# Patient Record
Sex: Male | Born: 2000 | Race: Black or African American | Hispanic: No | Marital: Single | Smoking: Never smoker
Health system: Southern US, Community
[De-identification: ages and names within clinical notes are randomized; demographics above are authoritative.]

## PROBLEM LIST (undated history)

## (undated) HISTORY — PX: TONSILLECTOMY: SUR1361

---
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
## 2020-06-09 ENCOUNTER — Emergency Department (HOSPITAL_COMMUNITY): Payer: PRIVATE HEALTH INSURANCE

## 2020-06-09 ENCOUNTER — Encounter (HOSPITAL_COMMUNITY): Payer: Self-pay | Admitting: *Deleted

## 2020-06-09 ENCOUNTER — Emergency Department (HOSPITAL_COMMUNITY)
Admission: EM | Admit: 2020-06-09 | Discharge: 2020-06-09 | Disposition: A | Discharge status: Home / Self Care | Payer: PRIVATE HEALTH INSURANCE | Attending: Emergency Medicine | Admitting: Emergency Medicine

## 2020-06-09 DIAGNOSIS — R569 Unspecified convulsions: Secondary | ICD-10-CM | POA: Diagnosis present

## 2020-06-09 LAB — URINALYSIS, ROUTINE W REFLEX MICROSCOPIC
Bacteria, UA: NONE SEEN
Bilirubin Urine: NEGATIVE
Glucose, UA: NEGATIVE mg/dL
Ketones, ur: NEGATIVE mg/dL
Leukocytes,Ua: NEGATIVE
Nitrite: NEGATIVE
Protein, ur: NEGATIVE mg/dL
Specific Gravity, Urine: 1.017 (ref 1.005–1.030)
pH: 5 (ref 5.0–8.0)

## 2020-06-09 LAB — COMPREHENSIVE METABOLIC PANEL
ALT: 23 U/L (ref 0–44)
AST: 26 U/L (ref 15–41)
Albumin: 4.4 g/dL (ref 3.5–5.0)
Alkaline Phosphatase: 54 U/L (ref 38–126)
Anion gap: 10 (ref 5–15)
BUN: 12 mg/dL (ref 6–20)
CO2: 20 mmol/L — ABNORMAL LOW (ref 22–32)
Calcium: 9.1 mg/dL (ref 8.9–10.3)
Chloride: 109 mmol/L (ref 98–111)
Creatinine, Ser: 1.22 mg/dL (ref 0.61–1.24)
GFR calc Af Amer: >60 mL/min (ref >60)
GFR calc non Af Amer: >60 mL/min (ref >60)
Glucose, Bld: 128 mg/dL — ABNORMAL HIGH (ref 70–99)
Potassium: 4.2 mmol/L (ref 3.5–5.1)
Sodium: 139 mmol/L (ref 135–145)
Total Bilirubin: 1.2 mg/dL (ref 0.3–1.2)
Total Protein: 6.8 g/dL (ref 6.5–8.1)

## 2020-06-09 LAB — MAGNESIUM: Magnesium: 2.7 mg/dL — ABNORMAL HIGH (ref 1.7–2.4)

## 2020-06-09 LAB — CBC WITH DIFFERENTIAL/PLATELET
Abs Immature Granulocytes: 0.13 10*3/uL — ABNORMAL HIGH (ref 0.00–0.07)
Basophils Absolute: 0.1 10*3/uL (ref 0.0–0.1)
Basophils Relative: 1 %
Eosinophils Absolute: 0.1 10*3/uL (ref 0.0–0.5)
Eosinophils Relative: 1 %
HCT: 45.9 % (ref 39.0–52.0)
Hemoglobin: 14.1 g/dL (ref 13.0–17.0)
Immature Granulocytes: 2 %
Lymphocytes Relative: 10 %
Lymphs Abs: 0.8 10*3/uL (ref 0.7–4.0)
MCH: 21.7 pg — ABNORMAL LOW (ref 26.0–34.0)
MCHC: 30.7 g/dL (ref 30.0–36.0)
MCV: 70.5 fL — ABNORMAL LOW (ref 80.0–100.0)
Monocytes Absolute: 0.5 10*3/uL (ref 0.1–1.0)
Monocytes Relative: 5 %
Neutro Abs: 7.3 10*3/uL (ref 1.7–7.7)
Neutrophils Relative %: 81 %
Platelets: 145 10*3/uL — ABNORMAL LOW (ref 150–400)
RBC: 6.51 MIL/uL — ABNORMAL HIGH (ref 4.22–5.81)
RDW: 15.9 % — ABNORMAL HIGH (ref 11.5–15.5)
WBC: 8.8 10*3/uL (ref 4.0–10.5)
nRBC: 0 % (ref 0.0–0.2)

## 2020-06-09 LAB — ETHANOL: Alcohol, Ethyl (B): <10 mg/dL (ref <10)

## 2020-06-09 LAB — RAPID URINE DRUG SCREEN, HOSP PERFORMED
Amphetamines: NOT DETECTED
Barbiturates: NOT DETECTED
Benzodiazepines: NOT DETECTED
Cocaine: NOT DETECTED
Opiates: NOT DETECTED
Tetrahydrocannabinol: NOT DETECTED

## 2020-06-09 NOTE — Discharge Instructions (Signed)
Department of Motor Vehicle [facility]) of [place] regulations for seizures - It is the patient's responsibility to report the incidence of the seizure in the state of [place]. [place] has no statutory provision requiring physicians to report patients diagnosed with epilepsy or seizures to a central state agency.  The recommended DMV regulation requirement for a driver in [place] for an individual with a seizure is that they be seizure-free for 6-12 months. However, the DMV may consider the following exceptions to this general rule where: (1) a physician-directed change in medication causes a seizure and the individual immediately resumes the previous therapy which controlled seizures; (2) there is a history of nocturnal seizures or seizures which do not involve loss of consciousness, loss of control of motor function, or loss of appropriate sensation and information process; and (3) an individual has a seizure disorder preceded by an aura (warning) lasting 2-3 minutes. While the [facility] may also give consideration to other unusual circumstances which may affect the general requirement that drivers be seizure-free for 6-12 months, interpretation of these circumstances and assignment of restrictions is at the discretion of the Medical Advisor. The DMV also considers compliance with medical therapy essential for safe driving. [facility] [place] Physician's Guide to [employer] (June, 1995 ed.)] The Department learns of an individual's condition by inquiring on the application form or renewal form, a physician's report to the [facility], an accident report or from correspondence from the individual. The person may be required to submit a Medical Report Form either annually or semi-annually.  Do not drive, swim, take baths, operate heavy machinery, stand on ladders or other high platforms or do any other activities that would be dangerous if you had another seizure.   Contact a health care provider if: You have another  seizure. You have seizures more often. Your seizure symptoms change. You continue to have seizures with treatment. You have symptoms of an infection or illness. They might increase your risk of having a seizure. Get help right away if: You have a seizure: That lasts longer than 5 minutes. That is different than previous seizures. That leaves you unable to speak or use a part of your body. That makes it harder to breathe. After a head injury. You have: Multiple seizures in a row. Confusion or a severe headache right after a seizure. You are having seizures more often. You do not wake up immediately after a seizure. You injure yourself during a seizure.

## 2020-06-09 NOTE — ED Provider Notes (Signed)
[name] EMERGENCY DEPARTMENT Provider Note   CSN: [id number] Arrival date & time: 06/09/20  7829     History Chief Complaint  Patient presents with  . Seizures    new onset    Jay Harrison is a 19 y.o. male.  The history is provided by the patient and a relative. No language interpreter was used.  Seizures Seizure activity on arrival: no   Seizure type:  Grand mal Preceding symptoms: no sensation of an aura present, no headache and no vision change   Initial focality:  Unable to specify Episode characteristics: apnea, confusion, disorientation, generalized shaking, stiffening, tongue biting and unresponsiveness   Episode characteristics: no combativeness   Postictal symptoms: confusion   Return to baseline: yes   Severity:  Mild Duration: less than 5 minutes. Timing:  Once Number of seizures this episode:  1 Context: decreased sleep   Context: not alcohol withdrawal, not cerebral palsy, not change in medication, not developmental delay, not drug use, not emotional upset, not family hx of seizures, not fever, not flashing visual stimuli, not hydrocephalus, not intracranial lesion, not intracranial shunt, not possible hypoglycemia, not possible medication ingestion, not pregnant, not previous head injury and not stress   Recent head injury:  No recent head injuries History of seizures: no        No past medical history on file.  There are no problems to display for this patient.   No past surgical history on file.     No family history on file.  Social History   Tobacco Use  . Smoking status: Never Smoker  Substance Use Topics  . Alcohol use: No  . Drug use: No    Home Medications Prior to Admission medications   Medication Sig Start Date End Date Taking? Authorizing Provider  ofloxacin (OCUFLOX) 0.3 % ophthalmic solution 5 drops in the right EAR twice a day for 1 week. 08/07/15   Charm Rings, MD    Allergies     Penicillins  Review of Systems   Review of Systems  Neurological: Positive for seizures.  Ten systems reviewed and are negative for acute change, except as noted in the HPI.    Physical Exam Updated Vital Signs BP 125/78 (BP Location: Right Arm)   Pulse 96   Temp 98.2 F (36.8 C) (Oral)   Resp 18   SpO2 96%   Physical Exam Vitals and nursing note reviewed.  Constitutional:      General: He is not in acute distress.    Appearance: He is well-developed. He is not diaphoretic.  HENT:     Head: Normocephalic.     Comments: Petechiae around the eyes and neck Lip swelling and tooth marks to the upper and lower left lip without through and through laceration.  No vermilion border involvement Eyes:     General: No scleral icterus.    Conjunctiva/sclera: Conjunctivae normal.  Cardiovascular:     Rate and Rhythm: Normal rate and regular rhythm.     Heart sounds: Normal heart sounds.  Pulmonary:     Effort: Pulmonary effort is normal. No respiratory distress.     Breath sounds: Normal breath sounds.  Abdominal:     Palpations: Abdomen is soft.     Tenderness: There is no abdominal tenderness.  Musculoskeletal:     Cervical back: Normal range of motion and neck supple.  Skin:    General: Skin is warm and dry.  Neurological:     General: No  focal deficit present.     Mental Status: He is alert and oriented to person, place, and time. Mental status is at baseline.     Comments: Speech is clear and goal oriented, follows commands Major Cranial nerves without deficit, no facial droop Normal strength in upper and lower extremities bilaterally including dorsiflexion and plantar flexion, strong and equal grip strength Sensation normal to light and sharp touch Moves extremities without ataxia, coordination intact Normal finger to nose and rapid alternating movements Neg romberg, no pronator drift Normal gait  Psychiatric:        Behavior: Behavior normal.     ED Results /  Procedures / Treatments   Labs (all labs ordered are listed, but only abnormal results are displayed) Labs Reviewed - No data to display  EKG EKG Interpretation  Date/Time:  Saturday June 09 2020 09:44:24 EDT Ventricular Rate:  96 PR Interval:    QRS Duration: 104 QT Interval:  345 QTC Calculation: 436 R Axis:   82 Text Interpretation: Sinus rhythm no wpw, prolonged qt or brugada No old tracing to compare Confirmed by Deno Etienne [phone]) on 06/09/2020 9:51:06 AM   Radiology No results found.  Procedures Procedures (including critical care time)  Medications Ordered in ED Medications - No data to display  ED Course  I have reviewed the triage vital signs and the nursing notes.  Pertinent labs & imaging results that were available during my care of the patient were reviewed by me and considered in my medical decision making (see chart for details).    MDM Rules/Calculators/A&P                          This patient complains of seizure, this involves an extensive number of treatment options, and is a complaint that carries with it a high risk of complications and morbidity.  The differential diagnosis includes The differential diagnosis for includes but is not limited to idiopathic seizure, traumatic brain injury, intracranial hemorrhage, vascular lesion, mass or space containing lesion, degenerative neurologic disease, congenital brain abnormality, infectious etiology such as meningitis, encephalitis or abscess, metabolic disturbance including hyper or hypoglycemia, hyper or hyponatremia, hyperosmolar state, uremia, hepatic failure, hypocalcemia, hypomagnesemia.  Toxic substances such as cocaine, lidocaine, antidepressants, theophylline, alcohol withdrawal, drug withdrawal, eclampsia, hypertensive encephalopathy and anoxic brain injury.   I Ordered, reviewed, and interpreted labs, which included urine which is negative for infection, UDS within normal limits, magnesium just above  normal and likely of insignificant value. CMP with slightly elevated blood glucose likely acute phase reaction. I ordered imaging studies which included CT head and I independently visualized and interpreted imaging which showed no acute abnormalities Additional history obtained from parents at bedside Previous records obtained and reviewed   Patient with no evidence of focal neuro deficits on physical exam and is at mental baseline.  Labs and imaging have been reviewed.  Patient is advised to followup with PCP in regards to today's event.  Spoke with patient and family in detail about driving restrictions and home precautions.  Patient verbalizes understanding.  Answered all questions.  Patient is hemodynamically stable and in no acute distress prior to discharge.    Final Clinical Impression(s) / ED Diagnoses Final diagnoses:  Seizure [facility])    Rx / [place] Orders ED Discharge Orders    None       Margarita Mail, PA-C 06/09/20 [place], [place], DO 06/09/20 1512

## 2020-06-09 NOTE — ED Triage Notes (Signed)
Pt here via [facility] for new onset witnessed seizures.  Aunt heard a thud from upstairs and came down to see pt unresponsive with full body shaking.  When ems arrived pt was still post-ictal.  They placed 2L [place] on pt for sats of 91%, tachycardic in 119's.  Pt was post-ictal for approx 15 min.  No urinary incontinence, though he did bite his lip.  States drank a few beers last night, came home at 4 am.  Denies drug use.

## 2020-09-04 ENCOUNTER — Encounter: Payer: Self-pay | Admitting: Family Medicine

## 2020-09-04 ENCOUNTER — Ambulatory Visit (INDEPENDENT_AMBULATORY_CARE_PROVIDER_SITE_OTHER): Payer: PRIVATE HEALTH INSURANCE | Admitting: Family Medicine

## 2020-09-04 ENCOUNTER — Other Ambulatory Visit: Payer: Self-pay

## 2020-09-04 VITALS — BP 120/76 | HR 81 | Temp 97.9°F | Ht 74.5 in | Wt 201.8 lb

## 2020-09-04 DIAGNOSIS — R569 Unspecified convulsions: Secondary | ICD-10-CM | POA: Insufficient documentation

## 2020-09-04 DIAGNOSIS — Z0000 Encounter for general adult medical examination without abnormal findings: Secondary | ICD-10-CM | POA: Diagnosis not present

## 2020-09-04 NOTE — Assessment & Plan Note (Signed)
Reviewed ER visit. No clear cause of seizure though pt reports heavy alcohol use the night before, but no other hx of withdrawal seizures. Advised neurology consult to rule out disorder and evaluate further as this has not been done. Advised not riding a motorcycle or driving until more information about risk can be determined.

## 2020-09-04 NOTE — Patient Instructions (Addendum)
Seizure - neurology - should call you   #Referral I have placed a referral to a specialist for you. You should receive a phone call from the specialty office. Make sure your voicemail is not full and that if you are able to answer your phone to unknown or new numbers.   It may take up to 2 weeks to hear about the referral. If you do not hear anything in 2 weeks, please call our office and ask to speak with the referral coordinator.   Find a Education officer, community

## 2020-09-04 NOTE — Progress Notes (Signed)
Annual Exam   Chief Complaint:  Chief Complaint  Patient presents with  . Establish Care    no concerns    History of Present Illness:  Jay Harrison is a 19 y.o. presents today for annual examination.    #seizure - in June  - in the context of heavy drinking the night before - work up reassuring - has been driving. No recent seizure activity - cousin with similar experience of one seizure  Nutrition/Lifestyle Diet: unhealthy Exercise: gym 3 times a week, active job He is single partner, contraception - condoms most of the time.    Social History   Tobacco Use  Smoking Status Never Smoker  Smokeless Tobacco Never Used   Social History   Substance and Sexual Activity  Alcohol Use Yes   Comment: 1-2 times a month, 1-2    Social History   Substance and Sexual Activity  Drug Use No     Safety The patient wears seatbelts: yes.     The patient feels safe at home and in their relationships: yes.  General Health Dentist in the last year: No Eye doctor: yes  Weight Wt Readings from Last 3 Encounters:  09/04/20 201 lb 12 oz (91.5 kg)  06/09/20 190 lb (86.2 kg)   Patient has normal BMI  BMI Readings from Last 1 Encounters:  09/04/20 25.56 kg/m     Chronic disease screening Blood pressure monitoring:  BP Readings from Last 3 Encounters:  09/04/20 120/76  06/09/20 127/74  08/07/15 117/63    Lipid Monitoring: Indication for screening: age >35, obesity, diabetes, family hx, CV risk factors.  Lipid screening: Not Indicated  No results found for: CHOL, HDL, LDLCALC, LDLDIRECT, TRIG, CHOLHDL   Diabetes Screening: age >69, overweight, family hx, PCOS, hx of gestational diabetes, at risk ethnicity, elevated blood pressure >135/80.  Diabetes Screening screening: Not Indicated  No results found for: HGBA1C    There is no immunization history on file for this patient.  History reviewed. No pertinent past medical history.  Past Surgical History:   Procedure Laterality Date  . TONSILLECTOMY    . TYMPANOSTOMY TUBE PLACEMENT      Prior to Admission medications   Not on File    Allergies  Allergen Reactions  . Penicillins     unk     Social History   Socioeconomic History  . Marital status: Single    Spouse name: Not on file  . Number of children: Not on file  . Years of education: college  . Highest education level: Not on file  Occupational History  . Not on file  Tobacco Use  . Smoking status: Never Smoker  . Smokeless tobacco: Never Used  Vaping Use  . Vaping Use: Never used  Substance and Sexual Activity  . Alcohol use: Yes    Comment: 1-2 times a month, 1-2   . Drug use: No  . Sexual activity: Yes    Birth control/protection: Condom  Other Topics Concern  . Not on file  Social History Narrative   09/04/20   From: the area   Living: with mom and grandparents   Work: Curator currently   College: [employer] - transferring to [employer]      Family: good relationship with mom and grandparents      Enjoys: motorcycling, driving, basketball, pool      Exercise: gym - 3 times a week   Diet: eat a lot      Safety   Seat belts:  Yes    Guns: Yes  and secure   Safe in relationships: Yes    Social Determinants of Health   Financial Resource Strain:   . Difficulty of Paying Living Expenses: Not on file  Food Insecurity:   . Worried About Programme researcher, broadcasting/film/video in the Last Year: Not on file  . Ran Out of Food in the Last Year: Not on file  Transportation Needs:   . Lack of Transportation (Medical): Not on file  . Lack of Transportation (Non-Medical): Not on file  Physical Activity:   . Days of Exercise per Week: Not on file  . Minutes of Exercise per Session: Not on file  Stress:   . Feeling of Stress : Not on file  Social Connections:   . Frequency of Communication with Friends and Family: Not on file  . Frequency of Social Gatherings with Friends and Family: Not on file  . Attends Religious Services: Not on  file  . Active Member of Clubs or Organizations: Not on file  . Attends Banker Meetings: Not on file  . Marital Status: Not on file  Intimate Partner Violence:   . Fear of Current or Ex-Partner: Not on file  . Emotionally Abused: Not on file  . Physically Abused: Not on file  . Sexually Abused: Not on file    Family History  Problem Relation Age of Onset  . Cancer Father        unknown type   . High Cholesterol Maternal Grandfather     Review of Systems  Constitutional: Negative for chills and fever.  HENT: Negative for congestion and sore throat.   Eyes: Negative for blurred vision and double vision.  Respiratory: Negative for shortness of breath.   Cardiovascular: Negative for chest pain.  Gastrointestinal: Negative for heartburn, nausea and vomiting.  Genitourinary: Negative.   Musculoskeletal: Negative.  Negative for myalgias.  Skin: Negative for rash.  Neurological: Negative for dizziness and headaches.  Endo/Heme/Allergies: Does not bruise/bleed easily.  Psychiatric/Behavioral: Negative for depression. The patient is not nervous/anxious.      Physical Exam BP 120/76   Pulse 81   Temp 97.9 F (36.6 C) (Temporal)   Ht 6' 2.5" (1.892 m)   Wt 201 lb 12 oz (91.5 kg)   SpO2 97%   BMI 25.56 kg/m    BP Readings from Last 3 Encounters:  09/04/20 120/76  06/09/20 127/74  08/07/15 117/63      Physical Exam Constitutional:      General: He is not in acute distress.    Appearance: He is well-developed. He is not diaphoretic.  HENT:     Head: Normocephalic and atraumatic.     Right Ear: Tympanic membrane and ear canal normal.     Left Ear: Tympanic membrane and ear canal normal.     Nose: Nose normal.     Mouth/Throat:     Pharynx: Uvula midline.  Eyes:     General: No scleral icterus.    Conjunctiva/sclera: Conjunctivae normal.     Pupils: Pupils are equal, round, and reactive to light.  Cardiovascular:     Rate and Rhythm: Normal rate and  regular rhythm.     Heart sounds: Normal heart sounds. No murmur heard.   Pulmonary:     Effort: Pulmonary effort is normal. No respiratory distress.     Breath sounds: Normal breath sounds. No wheezing.  Abdominal:     General: Bowel sounds are normal. There is no distension.  Palpations: Abdomen is soft. There is no mass.     Tenderness: There is no abdominal tenderness. There is no guarding.  Musculoskeletal:        General: Normal range of motion.     Cervical back: Normal range of motion and neck supple.  Lymphadenopathy:     Cervical: No cervical adenopathy.  Skin:    General: Skin is warm and dry.     Capillary Refill: Capillary refill takes less than 2 seconds.  Neurological:     Mental Status: He is alert and oriented to person, place, and time.        Results:  PHQ-9:   Depression screen PHQ 2/9 09/04/2020  Decreased Interest 0  Down, Depressed, Hopeless 0  PHQ - 2 Score 0      Assessment: 19 y.o. here for routine annual physical examination.  Plan: Problem List Items Addressed This Visit      Other   Seizure [facility])    Reviewed ER visit. No clear cause of seizure though pt reports heavy alcohol use the night before, but no other hx of withdrawal seizures. Advised neurology consult to rule out disorder and evaluate further as this has not been done. Advised not riding a motorcycle or driving until more information about risk can be determined.       Relevant Orders   Ambulatory referral to Neurology    Other Visit Diagnoses    Annual physical exam    -  Primary      Screening: -- Blood pressure screen normal -- cholesterol screening: not due for screening -- Weight screening: normal -- Diabetes Screening: not due for screening -- Nutrition: Encouraged healthy diet and exercise  The ASCVD Risk score Denman George DC Jr., et al., 2013) failed to calculate for the following reasons:   The 2013 ASCVD risk score is only valid for ages 7 to 19  -- Statin  therapy for Age 45-75 with CVD risk >7.5%  Psych -- Depression screening (PHQ-9):    Safety -- tobacco screening: not using -- alcohol screening:  low-risk usage. -- no evidence of domestic violence or intimate partner violence.   Cancer Screening -- No age related cancer screening due  Immunizations  There is no immunization history on file for this patient.  -- flu vaccine declined -- TDAP q10 years unknown, record requested -- Covid-19 Vaccine unknown, record requested   Encouraged regular vision and dental screening. Encouraged healthy exercise and diet.   Lynnda Child

## 2020-09-11 ENCOUNTER — Encounter: Payer: Self-pay | Admitting: Neurology

## 2020-12-25 ENCOUNTER — Ambulatory Visit: Payer: PRIVATE HEALTH INSURANCE | Admitting: Neurology

## 2021-02-01 ENCOUNTER — Encounter: Payer: Self-pay | Admitting: Neurology

## 2021-02-01 ENCOUNTER — Ambulatory Visit (INDEPENDENT_AMBULATORY_CARE_PROVIDER_SITE_OTHER): Payer: BC Managed Care – PPO | Admitting: Neurology

## 2021-02-01 ENCOUNTER — Other Ambulatory Visit: Payer: Self-pay

## 2021-02-01 VITALS — BP 133/89 | HR 89 | Ht 74.5 in | Wt 209.0 lb

## 2021-02-01 DIAGNOSIS — R569 Unspecified convulsions: Secondary | ICD-10-CM | POA: Diagnosis not present

## 2021-02-01 NOTE — Progress Notes (Signed)
NEUROLOGY CONSULTATION NOTE  DARRAN GABAY MRN: [id number] DOB: 12-14-2001  Referring provider: Dr. Gweneth Dimitri Primary care provider: Dr. Gweneth Dimitri  Reason for consult:  seizures  Dear Dr Selena Batten:  Thank you for your kind referral of Jay Harrison for consultation of the above symptoms. Although his history is well known to you, please allow me to reiterate it for the purpose of our medical record. He is alone in the office today. Records and images were personally reviewed where available.   HISTORY OF PRESENT ILLNESS: This is a pleasant 20 year old right-handed man with no significant past medical history presenting for evaluation of new onset seizures. The first seizure occurred 06/09/20, his aunt heard him fall and found him unresponsive with full body shaking. He was reportedly post-ictal for 15 minutes with EMS. He had lip swelling and tooth marks on the upper and lower left lip. CBC, CMP, UDS, EtOH level negative. I personally reviewed head CT without contrast which did not show any acute changes. He had another seizure over a month ago, his grandmother said "I was stuck." he recalls being in his bed, then waking up in a chair. He states both occurred early in the morning, as he was waking up. He reports that both times, he had been drinking alcohol heavily for a week and was really mad, not sleeping well, and not eating much. He had been feeling really bad. When he comes to, he reports feeling back to normal, no focal weakness. He has stopped drinking alcohol since then and feels better. He lives with his mother. He denies any staring/unresponsive episodes, gaps in time, olfactory/gustatory hallucinations, deja vu, rising epigastric sensation, focal numbness/tingling/weakness, myoclonic jerks. He denies any headaches, dizziness, diplopia, dysarthria/dysphagia, neck/back pain, bowel/bladder dysfunction. Memory is really good. He works as a Curator. He had a normal birth and early  development.  There is no history of febrile convulsions, CNS infections such as meningitis/encephalitis, significant traumatic brain injury, neurosurgical procedures, or family history of seizures.   PAST MEDICAL HISTORY: History reviewed. No pertinent past medical history.  PAST SURGICAL HISTORY: Past Surgical History:  Procedure Laterality Date  . TONSILLECTOMY    . TYMPANOSTOMY TUBE PLACEMENT      MEDICATIONS: No current outpatient medications on file prior to visit.   No current facility-administered medications on file prior to visit.    ALLERGIES: Allergies  Allergen Reactions  . Penicillins     unk    FAMILY HISTORY: Family History  Problem Relation Age of Onset  . Cancer Father        unknown type   . High Cholesterol Maternal Grandfather     SOCIAL HISTORY: Social History   Socioeconomic History  . Marital status: Single    Spouse name: Not on file  . Number of children: Not on file  . Years of education: college  . Highest education level: Not on file  Occupational History  . Not on file  Tobacco Use  . Smoking status: Never Smoker  . Smokeless tobacco: Never Used  Vaping Use  . Vaping Use: Never used  Substance and Sexual Activity  . Alcohol use: Not Currently    Comment: 1-2 times a month, 1-2   . Drug use: No  . Sexual activity: Yes    Birth control/protection: Condom  Other Topics Concern  . Not on file  Social History Narrative   09/04/20   From: the area   Living: with mom and grandparents  Work: Curator currently   [employer]: [employer] - transferring to [employer]      Family: good relationship with mom and grandparents      Enjoys: motorcycling, driving, basketball, pool      Exercise: gym - 3 times a week   Diet: eat a lot      Safety   Seat belts: Yes    Guns: Yes  and secure   Safe in relationships: Yes       Right handed   Social Determinants of Health   Financial Resource Strain: Not on file  Food Insecurity: Not on file   Transportation Needs: Not on file  Physical Activity: Not on file  Stress: Not on file  Social Connections: Not on file  Intimate Partner Violence: Not on file     PHYSICAL EXAM: Vitals:   02/01/21 1240  BP: 133/89  Pulse: 89  SpO2: 100%   General: No acute distress Head:  Normocephalic/atraumatic Skin/Extremities: No rash, no edema Neurological Exam: Mental status: alert and oriented to person, place, and time, no dysarthria or aphasia, Fund of knowledge is appropriate.  Remote memory intact 1/3 delayed recall..  Attention and concentration are normal, 5/5 WORLD backwards. Cranial nerves: CN I: not tested CN II: pupils equal, round and reactive to light, visual fields intact CN III, IV, VI:  full range of motion, no nystagmus, no ptosis CN V: facial sensation intact CN VII: upper and lower face symmetric CN VIII: hearing intact to conversation Bulk & Tone: normal, no fasciculations. Motor: 5/5 throughout with no pronator drift. Sensation: intact to light touch, cold, pin, vibration and joint position sense.  No extinction to double simultaneous stimulation.  Romberg test negative Deep Tendon Reflexes: +2 throughout Plantar responses: downgoing bilaterally Cerebellar: no incoordination on finger to nose testing Gait: narrow-based and steady, able to tandem walk adequately. Tremor: none   IMPRESSION: This is a pleasant 20 year old right-handed man with new onset seizures, he had a convulsion in June 2021, and most recently in January 2022. He reports both occurred in the setting of heavy alcohol intake for a week, increased stress/anger, and poor sleep. He has stopped drinking alcohol. We discussed different causes of seizures,continued alcohol cessation was encouraged. A 1-hour EEG will be ordered to assess for abnormalities that increase risk for recurrent seizures. We discussed avoidance of seizure triggers, including alcohol and sleep deprivation. [place] driving laws were  discussed with the patient, and he knows to stop driving after a seizure, until 6 months seizure-free. Follow-up in 6 months, he knows to call for any changes.   Thank you for allowing me to participate in the care of this patient. Please do not hesitate to call for any questions or concerns.   [name], M.D.  CC: Dr. Selena Batten

## 2021-02-01 NOTE — Patient Instructions (Signed)
1. Schedule 1-hour EEG  2. Continue with alcohol avoidance  3. Follow-up in 6 months, call for any changes   Seizure Precautions: 1. If medication has been prescribed for you to prevent seizures, take it exactly as directed.  Do not stop taking the medicine without talking to your doctor first, even if you have not had a seizure in a long time.   2. Avoid activities in which a seizure would cause danger to yourself or to others.  Don't operate dangerous machinery, swim alone, or climb in high or dangerous places, such as on ladders, roofs, or girders.  Do not drive unless your doctor says you may.  3. If you have any warning that you may have a seizure, lay down in a safe place where you can't hurt yourself.    4.  No driving for 6 months from last seizure, as per [facility].   Please refer to the following link on the Epilepsy Foundation of America's website for more information: http://www.epilepsyfoundation.org/answerplace/Social/driving/drivingu.cfm    5.  Maintain good sleep hygiene. Avoid alcohol.  6.  Contact your doctor if you have any problems that may be related to the medicine you are taking.  7.  Call 911 and bring the patient back to the ED if:        A.  The seizure lasts longer than 5 minutes.       B.  The patient doesn't awaken shortly after the seizure  C.  The patient has new problems such as difficulty seeing, speaking or moving  D.  The patient was injured during the seizure  E.  The patient has a temperature over 102 F (39C)  F.  The patient vomited and now is having trouble breathing

## 2021-02-13 ENCOUNTER — Ambulatory Visit (INDEPENDENT_AMBULATORY_CARE_PROVIDER_SITE_OTHER): Payer: PRIVATE HEALTH INSURANCE | Admitting: Neurology

## 2021-02-13 ENCOUNTER — Other Ambulatory Visit: Payer: Self-pay

## 2021-02-13 DIAGNOSIS — R569 Unspecified convulsions: Secondary | ICD-10-CM

## 2021-02-22 NOTE — Procedures (Signed)
ELECTROENCEPHALOGRAM REPORT  Date of Study: 02/13/2021  Patient's Name: Jay Harrison MRN: [id number] Date of Birth: [birth year]  Referring Provider: Dr. [name]  Clinical History: This is a 20 year old with new onset seizures. EEG for classification.  Medications: none  Technical Summary: A multichannel digital 1-hour EEG recording measured by the international 10-20 system with electrodes applied with paste and impedances below 5000 ohms performed in our laboratory with EKG monitoring in an awake and asleep patient.  Hyperventilation was not performed. Photic stimulation was performed.  The digital EEG was referentially recorded, reformatted, and digitally filtered in a variety of bipolar and referential montages for optimal display.    Description: The patient is awake and asleep during the recording.  During maximal wakefulness, there is a symmetric, medium voltage 10 Hz posterior dominant rhythm that attenuates with eye opening.  The record is symmetric.  During drowsiness and sleep, there is an increase in theta slowing of the background.  Vertex waves and symmetric sleep spindles were seen.  Photic stimulation did not elicit any abnormalities.  There were no epileptiform discharges or electrographic seizures seen.    EKG lead was unremarkable.  Impression: This 1-hour awake and asleep EEG is normal.    Clinical Correlation: A normal EEG does not exclude a clinical diagnosis of epilepsy.  If further clinical questions remain, prolonged EEG may be helpful.  Clinical correlation is advised.   [name], M.D.

## 2021-03-14 ENCOUNTER — Ambulatory Visit: Payer: PRIVATE HEALTH INSURANCE | Admitting: Neurology

## 2021-07-27 IMAGING — CT CT HEAD W/O CM
4 series · 17 of 47 positions shown, 19 images · non-contrast
Comparison: None.

CLINICAL DATA: Seizure.  Abnormal neuro exam.

EXAM:
CT HEAD WITHOUT CONTRAST
TECHNIQUE: Contiguous axial images were obtained from the base of the skull
through the vertex without intravenous contrast.

[Series 3: head without · axial · non-contrast · 0.48mm/px · z∈[-124,-4]mm · 7 of 33 slices shown, 9 images]
[im 5/33  brain]
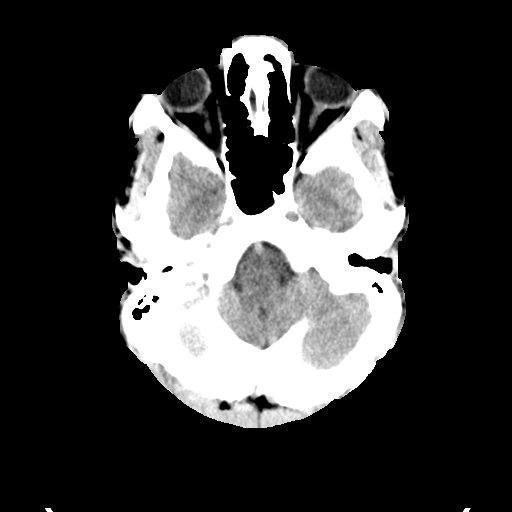
[im 5/33  bone]
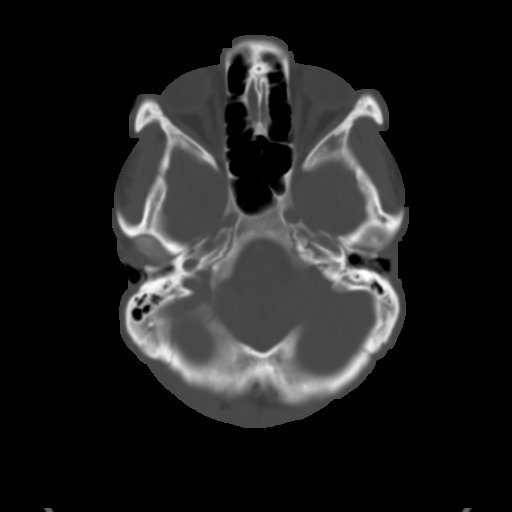
[im 9/33  brain]
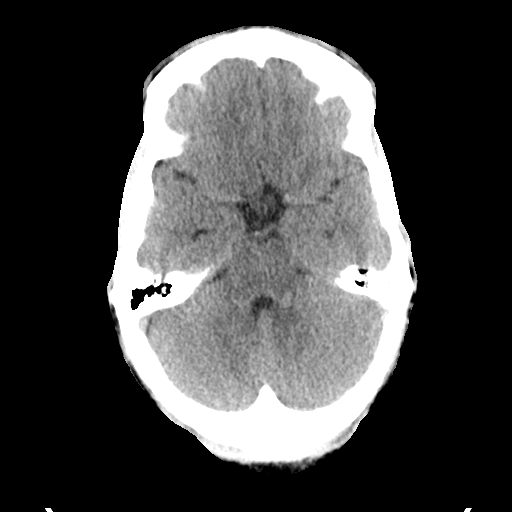
[im 13/33  brain]
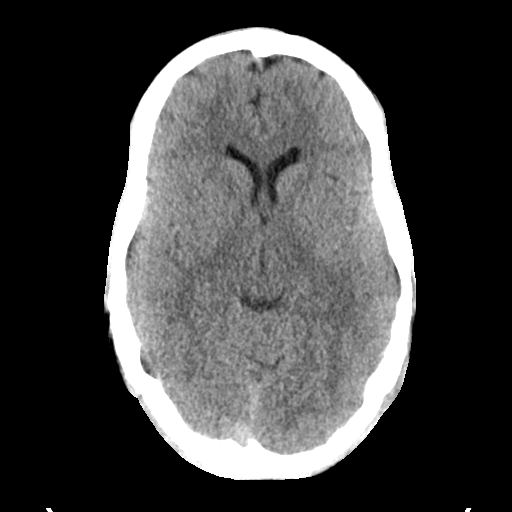
[im 17/33  brain]
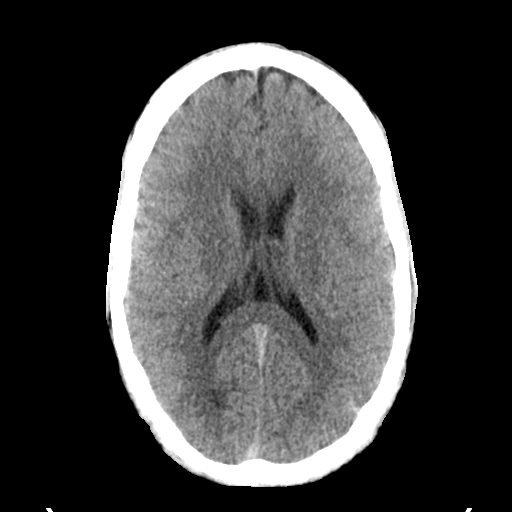
[im 21/33  brain]
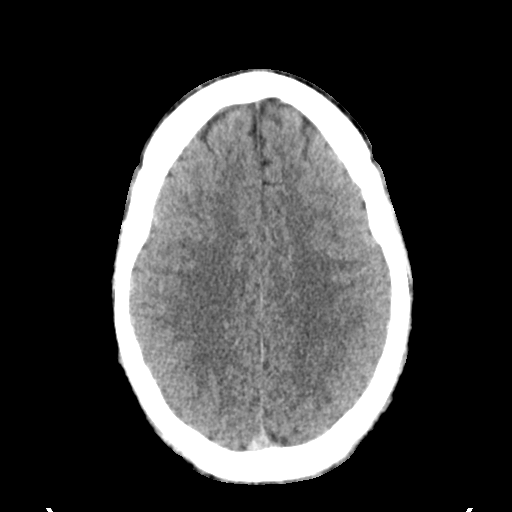
[im 21/33  bone]
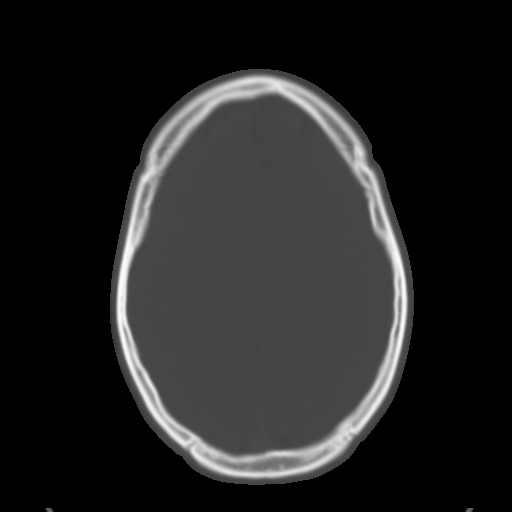
[im 25/33  brain]
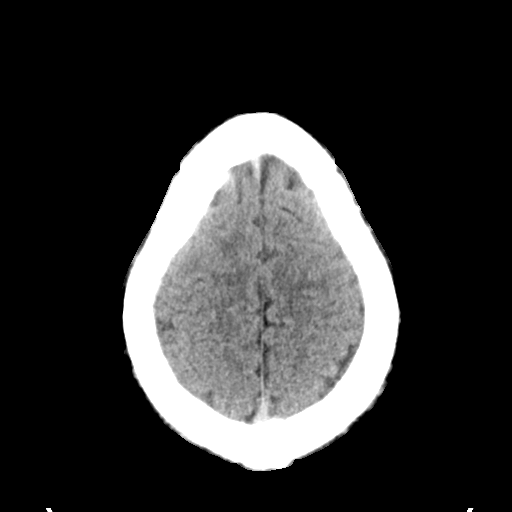
[im 29/33  brain]
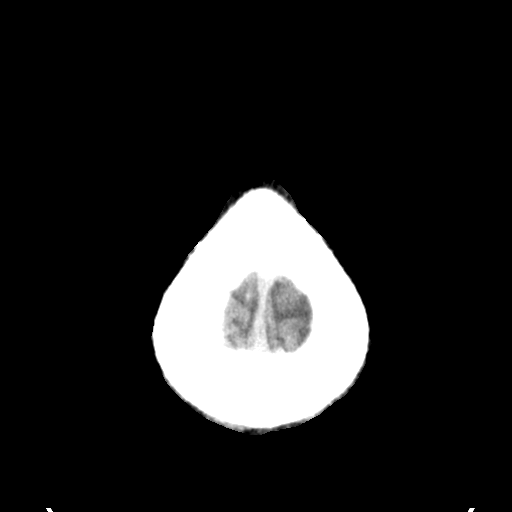

[Series 4: head bone · axial · 0.48mm/px · z∈[-128,-72]mm · 4 of 83 slices shown]
[im 9/83  bone]
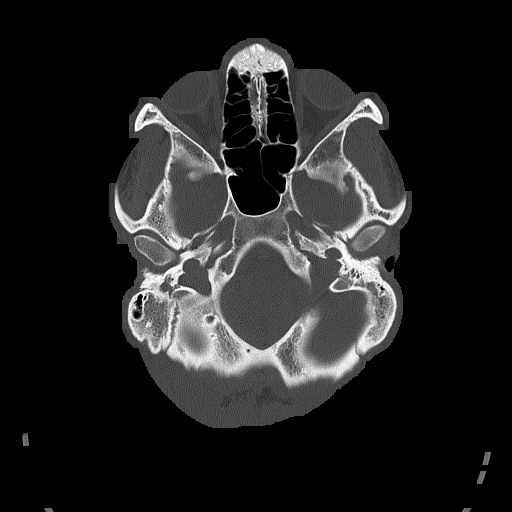
[im 17/83  bone]
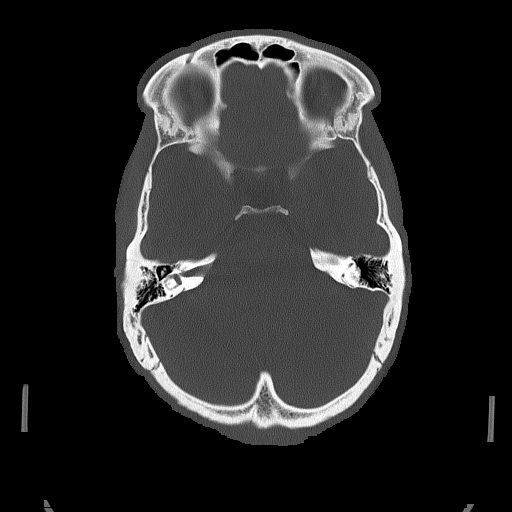
[im 25/83  bone]
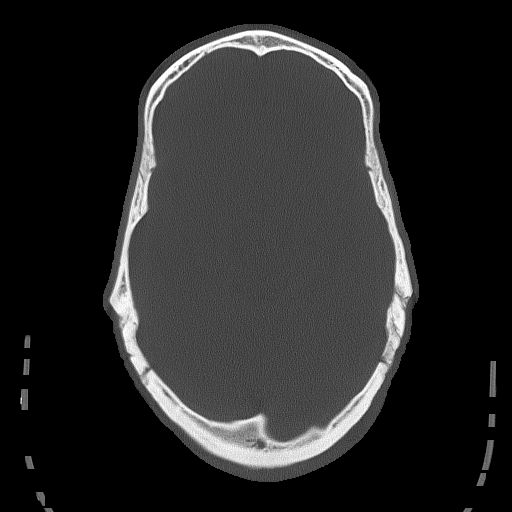
[im 37/83  bone]
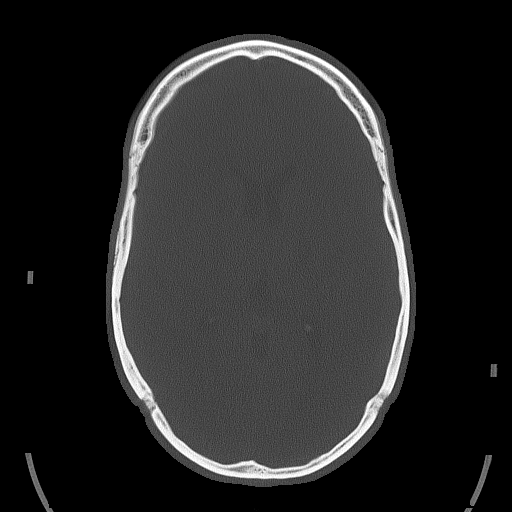

[Series 5: head without cor · coronal · non-contrast · 0.33mm/px · 3 of 75 slices shown]
[im 25/75  brain]
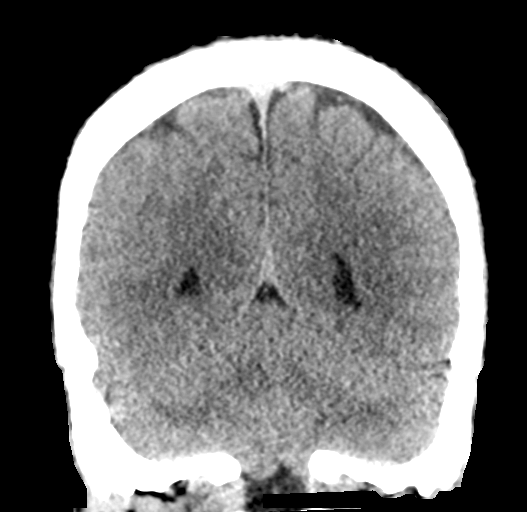
[im 33/75  brain]
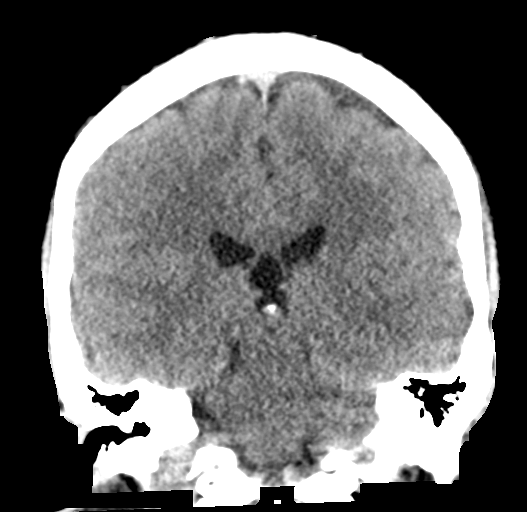
[im 42/75  brain]
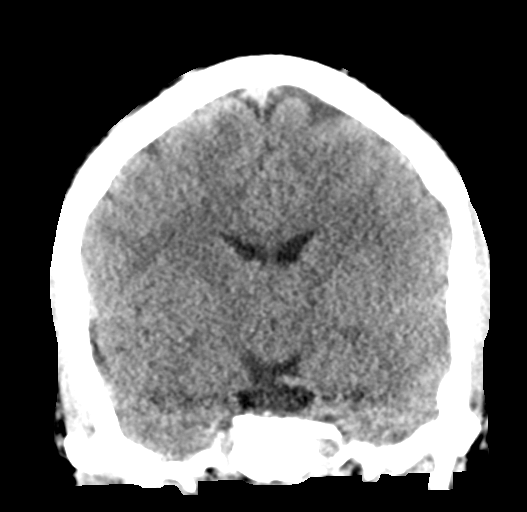

[Series 6: head without sag · sagittal · non-contrast · 0.33mm/px · 3 of 66 slices shown]
[im 22/66  brain]
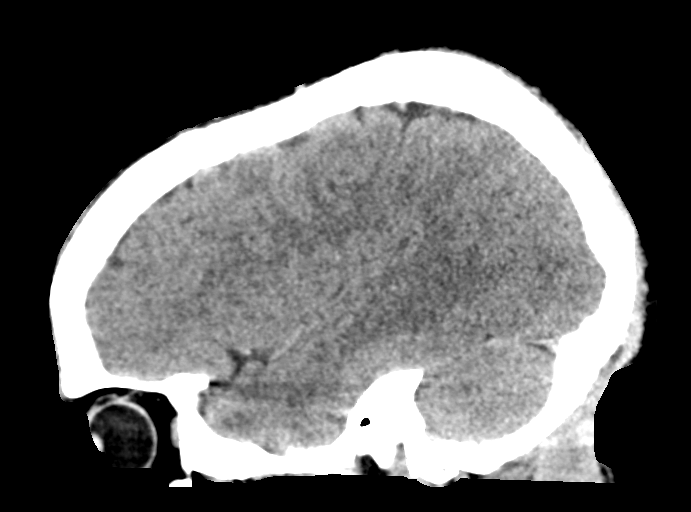
[im 33/66  brain]
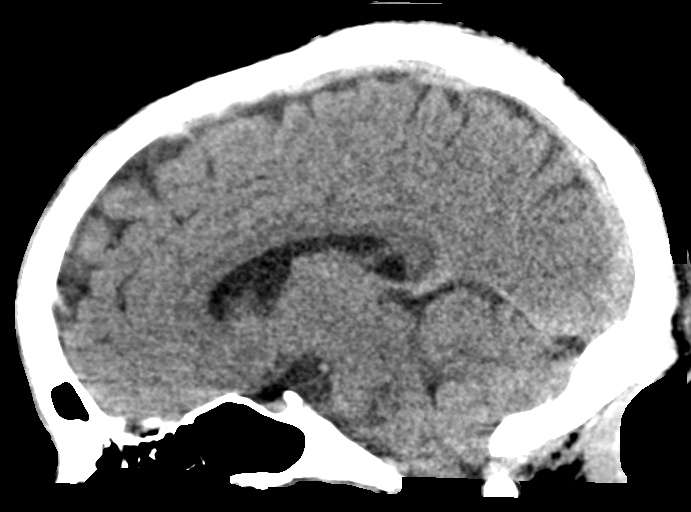
[im 44/66  brain]
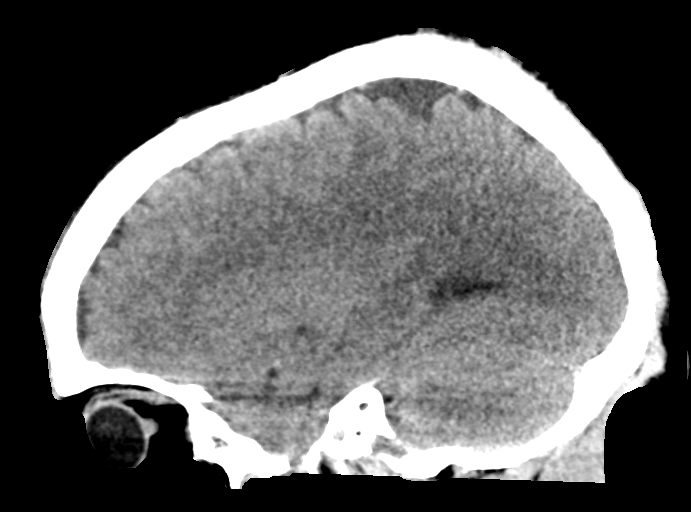

[17 of 47 positions shown; findings below may reference images not displayed]

FINDINGS: Brain: No subdural, epidural, or subarachnoid hemorrhage. Brainstem,
basal cisterns, and cerebellum are normal. Ventricles and sulci are
normal. No mass effect or midline shift. No acute cortical ischemia
or infarct.

Vascular: No hyperdense vessel or unexpected calcification.

Skull: Normal. Negative for fracture or focal lesion.

Sinuses/Orbits: No acute finding.

Other: None.
IMPRESSION: 1. No acute intracranial abnormalities.

## 2021-08-23 ENCOUNTER — Telehealth: Payer: Self-pay | Admitting: Neurology

## 2021-08-23 NOTE — Telephone Encounter (Signed)
Spoke with the patient and he stated he never got his EEG results from 02/13/21.

## 2021-08-23 NOTE — Telephone Encounter (Signed)
Pls let him know the EEG was normal. It is not like a pregnancy test that is positive or negative, just a snapshot of his brain waves. Has he had any more seizures? Thanks

## 2021-08-23 NOTE — Telephone Encounter (Signed)
Pt called and informed EEG was normal. It is not like a pregnancy test that is positive or negative, just a snapshot of his brain waves. He has not had any seizures, and has not had any alcohol

## 2021-08-27 ENCOUNTER — Ambulatory Visit: Payer: PRIVATE HEALTH INSURANCE | Admitting: Neurology

## 2021-12-09 ENCOUNTER — Ambulatory Visit (INDEPENDENT_AMBULATORY_CARE_PROVIDER_SITE_OTHER): Payer: No Typology Code available for payment source | Admitting: Neurology

## 2021-12-09 ENCOUNTER — Encounter: Payer: Self-pay | Admitting: Neurology

## 2021-12-09 ENCOUNTER — Other Ambulatory Visit: Payer: Self-pay

## 2021-12-09 VITALS — BP 127/87 | HR 79 | Ht 75.0 in | Wt 225.2 lb

## 2021-12-09 DIAGNOSIS — R569 Unspecified convulsions: Secondary | ICD-10-CM

## 2021-12-09 NOTE — Progress Notes (Signed)
NEUROLOGY FOLLOW UP OFFICE NOTE  Jay Harrison [id number] [birth year]  HISTORY OF PRESENT ILLNESS: I had the pleasure of seeing Jay Harrison in follow-up in the neurology clinic on 12/09/2021.  The patient was last seen 10 months ago for seizures. He had one in 05/2020 and another in 12/2020, both occurred early in the morning, both times he had been drinking alcohol heavily for a week and was not sleeping well or eating much. His 1-hour EEG in 01/2021 was normal. He had stopped alcohol use and has been eating more regularly and sleeping more, doing well seizure-free since 12/2020. He lives alone and denies any staring/unresponsive episodes, gaps in time, olfactory/gustatory hallucinations, focal numbness/tingling/weakness, myoclonic jerks. No significant headaches, dizziness, vision changes, no falls.    History on Initial Assessment 02/01/2021: This is a pleasant 20 year old right-handed man with no significant past medical history presenting for evaluation of new onset seizures. The first seizure occurred 06/09/20, his aunt heard him fall and found him unresponsive with full body shaking. He was reportedly post-ictal for 15 minutes with EMS. He had lip swelling and tooth marks on the upper and lower left lip. CBC, CMP, UDS, EtOH level negative. I personally reviewed head CT without contrast which did not show any acute changes. He had another seizure over a month ago, his grandmother said "I was stuck." he recalls being in his bed, then waking up in a chair. He states both occurred early in the morning, as he was waking up. He reports that both times, he had been drinking alcohol heavily for a week and was really mad, not sleeping well, and not eating much. He had been feeling really bad. When he comes to, he reports feeling back to normal, no focal weakness. He has stopped drinking alcohol since then and feels better. He lives with his mother. He denies any staring/unresponsive episodes, gaps in time,  olfactory/gustatory hallucinations, deja vu, rising epigastric sensation, focal numbness/tingling/weakness, myoclonic jerks. He denies any headaches, dizziness, diplopia, dysarthria/dysphagia, neck/back pain, bowel/bladder dysfunction. Memory is really good. He works as a Curator. He had a normal birth and early development.  There is no history of febrile convulsions, CNS infections such as meningitis/encephalitis, significant traumatic brain injury, neurosurgical procedures, or family history of seizures.  PAST MEDICAL HISTORY: History reviewed. No pertinent past medical history.  MEDICATIONS: No current outpatient medications on file prior to visit.   No current facility-administered medications on file prior to visit.    ALLERGIES: Allergies  Allergen Reactions   Penicillins     unk    FAMILY HISTORY: Family History  Problem Relation Age of Onset   Cancer Father        unknown type    High Cholesterol Maternal Grandfather     SOCIAL HISTORY: Social History   Socioeconomic History   Marital status: Single    Spouse name: Not on file   Number of children: Not on file   Years of education: college   Highest education level: Not on file  Occupational History   Not on file  Tobacco Use   Smoking status: Never   Smokeless tobacco: Never  Vaping Use   Vaping Use: Never used  Substance and Sexual Activity   Alcohol use: Not Currently    Comment: 1-2 times a month, 1-2    Drug use: No   Sexual activity: Yes    Birth control/protection: Condom  Other Topics Concern   Not on file  Social History Narrative  09/04/20   From: the area   Living: with mom and grandparents   Work: Curator currently   College: [employer] - transferring to [employer]      Family: good relationship with mom and grandparents      Enjoys: motorcycling, driving, basketball, pool      Exercise: gym - 3 times a week   Diet: eat a lot      Safety   Seat belts: Yes    Guns: Yes  and secure   Safe in  relationships: Yes       Right handed   Social Determinants of Health   Financial Resource Strain: Not on file  Food Insecurity: Not on file  Transportation Needs: Not on file  Physical Activity: Not on file  Stress: Not on file  Social Connections: Not on file  Intimate Partner Violence: Not on file     PHYSICAL EXAM: Vitals:   12/09/21 1420  BP: 127/87  Pulse: 79  SpO2: 98%   General: No acute distress Head:  Normocephalic/atraumatic Skin/Extremities: No rash, no edema Neurological Exam: alert and awake. No aphasia or dysarthria. Fund of knowledge is appropriate.  Attention and concentration are normal.   Cranial nerves: Pupils equal, round. Extraocular movements intact with no nystagmus. Visual fields full.  No facial asymmetry.  Motor: Bulk and tone normal, muscle strength 5/5 throughout with no pronator drift.   Finger to nose testing intact.  Gait narrow-based and steady, able to tandem walk adequately.  Romberg negative.   IMPRESSION: This is a pleasant 20 yo RH man with new onset seizures, he had a convulsion in June 2021, and another episode in January 2022. Both occurred in the setting of heavy alcohol intake for a week, increased stress, poor sleep/eating habits. His 1-hour EEG was normal. He denies any seizures or seizure-like symptoms since 12/2020 and has stopped alcohol intake and did lifestyle changes. We discussed avoidance of seizure triggers, we agreed to hold off on seizure medication at this time however if seizures recur without any triggers, we will plan to start seizure medication. He is aware of [place] driving laws to stop driving after a seizure until 6 months seizure-free. Follow-up in 6-8 months, call for any changes.    Thank you for allowing me to participate in his care.  Please do not hesitate to call for any questions or concerns.    [name], M.D.   CC: Dr. Selena Batten

## 2021-12-09 NOTE — Patient Instructions (Signed)
Good to hear you are doing well. Follow-up in 6-8 months, call for any changes   Seizure Precautions: 1. If medication has been prescribed for you to prevent seizures, take it exactly as directed.  Do not stop taking the medicine without talking to your doctor first, even if you have not had a seizure in a long time.   2. Avoid activities in which a seizure would cause danger to yourself or to others.  Don't operate dangerous machinery, swim alone, or climb in high or dangerous places, such as on ladders, roofs, or girders.  Do not drive unless your doctor says you may.  3. If you have any warning that you may have a seizure, lay down in a safe place where you can't hurt yourself.    4.  No driving for 6 months from last seizure, as per [facility].   Please refer to the following link on the Epilepsy Foundation of America's website for more information: http://www.epilepsyfoundation.org/answerplace/Social/driving/drivingu.cfm    5.  Maintain good sleep hygiene. Avoid alcohol.  6.  Contact your doctor if you have any problems that may be related to the medicine you are taking.  7.  Call 911 and bring the patient back to the ED if:        A.  The seizure lasts longer than 5 minutes.       B.  The patient doesn't awaken shortly after the seizure  C.  The patient has new problems such as difficulty seeing, speaking or moving  D.  The patient was injured during the seizure  E.  The patient has a temperature over 102 F (39C)  F.  The patient vomited and now is having trouble breathing

## 2022-06-01 ENCOUNTER — Emergency Department (HOSPITAL_COMMUNITY)
Admission: EM | Admit: 2022-06-01 | Discharge: 2022-06-01 | Disposition: A | Discharge status: Home / Self Care | Payer: No Typology Code available for payment source | Attending: Emergency Medicine | Admitting: Emergency Medicine

## 2022-06-01 ENCOUNTER — Encounter (HOSPITAL_COMMUNITY): Payer: Self-pay

## 2022-06-01 ENCOUNTER — Other Ambulatory Visit: Payer: Self-pay

## 2022-06-01 DIAGNOSIS — S161XXA Strain of muscle, fascia and tendon at neck level, initial encounter: Secondary | ICD-10-CM | POA: Diagnosis not present

## 2022-06-01 DIAGNOSIS — S199XXA Unspecified injury of neck, initial encounter: Secondary | ICD-10-CM | POA: Diagnosis present

## 2022-06-01 DIAGNOSIS — S39012A Strain of muscle, fascia and tendon of lower back, initial encounter: Secondary | ICD-10-CM | POA: Diagnosis not present

## 2022-06-01 DIAGNOSIS — Y92410 Unspecified street and highway as the place of occurrence of the external cause: Secondary | ICD-10-CM | POA: Diagnosis not present

## 2022-06-01 MED ORDER — IBUPROFEN 800 MG PO TABS
800.0000 mg | ORAL_TABLET | Freq: Once | ORAL | Status: AC
  Administered 2022-06-01: 800 mg via ORAL
  Filled 2022-06-01: qty 1

## 2022-06-01 MED ORDER — IBUPROFEN 600 MG PO TABS: 600.0000 mg | ORAL_TABLET | Freq: Four times a day (QID) | ORAL | 0 refills | Status: AC | PRN

## 2022-06-01 MED ORDER — CYCLOBENZAPRINE HCL 10 MG PO TABS: 10.0000 mg | ORAL_TABLET | Freq: Three times a day (TID) | ORAL | 0 refills | Status: AC | PRN
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
## 2022-06-24 ENCOUNTER — Encounter (HOSPITAL_COMMUNITY): Payer: Self-pay | Admitting: Emergency Medicine

## 2022-06-24 ENCOUNTER — Emergency Department (HOSPITAL_COMMUNITY)
Admission: EM | Admit: 2022-06-24 | Discharge: 2022-06-24 | Disposition: A | Discharge status: Home / Self Care | Payer: No Typology Code available for payment source | Attending: Emergency Medicine | Admitting: Emergency Medicine

## 2022-06-24 ENCOUNTER — Other Ambulatory Visit: Payer: Self-pay

## 2022-06-24 DIAGNOSIS — S00561A Insect bite (nonvenomous) of lip, initial encounter: Secondary | ICD-10-CM | POA: Diagnosis present

## 2022-06-24 DIAGNOSIS — W57XXXA Bitten or stung by nonvenomous insect and other nonvenomous arthropods, initial encounter: Secondary | ICD-10-CM | POA: Insufficient documentation

## 2022-06-24 MED ORDER — PREDNISONE 20 MG PO TABS: 40.0000 mg | ORAL_TABLET | Freq: Every day | ORAL | 0 refills | Status: AC

## 2022-06-24 NOTE — Discharge Instructions (Signed)
You came to the emergency department today to be evaluated for your lip swelling after being stung by an insect.  The swelling appears to be a localized reaction to the sting.  I have prescribed you with a course of steroids to help decrease inflammation.  Additionally may apply ice to the area for 20 minutes at a time and then give yourself a 20-minute break in between.  I have given you a prescription for steroids today.  Some common side effects include feelings of extra energy, feeling warm, increased appetite, and stomach upset.  If you are diabetic your sugars may run higher than usual.   You develop symptoms of an anaphylactic reaction. These may include: Flushed skin. Hives. Swelling of the eyes, lips, face, mouth, tongue, or throat. Difficulty breathing, speaking, or swallowing. Wheezing. Dizziness or light-headedness. Fainting. Pain or cramping in the abdomen. Vomiting. Diarrhea.

## 2022-06-24 NOTE — ED Provider Notes (Signed)
[name] EMERGENCY DEPARTMENT Provider Note   CSN: [id number] Arrival date & time: 06/24/22  4332     History  Chief Complaint  Patient presents with   Insect Bite    Jay Harrison is a 21 y.o. male status post history of hysterectomy.  Presents emergency department for complaint of insect sting.  Patient reports that yesterday at 12 AM he was stung by a wasp or hornet on his lower lip.  Patient reports significant swelling to lower lip.  Patient denies any hives, trouble swallowing, trouble breathing, additional facial swelling, nausea, vomiting, diarrhea, abdominal pain.  Patient reports that he has been stung at least once before approximately 2 weeks prior.  Patient was started his neck and did have localized swelling but no other symptoms.  HPI     Home Medications Prior to Admission medications   Medication Sig Start Date End Date Taking? Authorizing Provider  cyclobenzaprine (FLEXERIL) 10 MG tablet Take 1 tablet (10 mg total) by mouth 3 (three) times daily as needed for muscle spasms. 06/01/22   Roxy Horseman, PA-C  ibuprofen (ADVIL) 600 MG tablet Take 1 tablet (600 mg total) by mouth every 6 (six) hours as needed. 06/01/22   Roxy Horseman, PA-C      Allergies    Penicillins    Review of Systems   Review of Systems  Constitutional:  Negative for chills and fever.  HENT:  Positive for facial swelling.   Respiratory:  Negative for shortness of breath.   Gastrointestinal:  Negative for abdominal pain, nausea and vomiting.  Skin:  Negative for color change, pallor, rash and wound.    Physical Exam Updated Vital Signs BP 127/80 (BP Location: Left Arm)   Pulse 87   Temp 97.8 F (36.6 C)   Resp 15   SpO2 100%  Physical Exam Vitals and nursing note reviewed.  Constitutional:      General: He is not in acute distress.    Appearance: He is not ill-appearing, toxic-appearing or diaphoretic.  HENT:     Head: Normocephalic.     Jaw: No  trismus, tenderness, swelling, pain on movement or malocclusion.     Mouth/Throat:     Lips: Pink. No lesions.     Mouth: Mucous membranes are moist.     Tongue: No lesions. Tongue does not deviate from midline.     Palate: No mass.     Pharynx: Oropharynx is clear. Uvula midline. No pharyngeal swelling, oropharyngeal exudate, posterior oropharyngeal erythema or uvula swelling.     Tonsils: No tonsillar exudate or tonsillar abscesses. 0 on the right. 0 on the left.     Comments: Significant swelling to lower lip.  Handles oral secretions without difficulty. Eyes:     General: No scleral icterus.       Right eye: No discharge.        Left eye: No discharge.  Cardiovascular:     Rate and Rhythm: Normal rate.  Pulmonary:     Effort: Pulmonary effort is normal. No tachypnea, bradypnea or respiratory distress.     Breath sounds: Normal breath sounds. No stridor.  Skin:    General: Skin is warm and dry.     Comments: No rash to bilateral upper extremities, face, chest, abdomen, or back.  Neurological:     General: No focal deficit present.     Mental Status: He is alert and oriented to person, place, and time.     GCS: GCS eye subscore is  4. GCS verbal subscore is 5. GCS motor subscore is 6.  Psychiatric:        Behavior: Behavior is cooperative.     ED Results / Procedures / Treatments   Labs (all labs ordered are listed, but only abnormal results are displayed) Labs Reviewed - No data to display  EKG None  Radiology No results found.  Procedures Procedures    Medications Ordered in ED Medications - No data to display  ED Course/ Medical Decision Making/ A&P                           Medical Decision Making  Alert 21 year old male in no acute distress, nontoxic-appearing.  Presents emergency department after suffering a insect sting.  Information obtained from patient and patient's significant other at bedside.  I reviewed patient's past medical records including  previous bladder notes, labs, and imaging.  Patient noted to have swelling to lower lip where he was stung by a wasp or hornet.  Patient has no other facial swelling.  Handles oral secretions without difficulty.  Lungs clear to auscultation without adventitious lung sounds.  Patient does not have any rash.  Denies any nausea, vomiting, diarrhea, or abdominal pain.  Suspect that patient's swelling is secondary to localized reaction from sting.  No signs of anaphylaxis.  We will treat patient with 5-day course of steroids.  Discussed symptomatic treatment with Benadryl and ice.  Discussed strict return precautions.  Based on patient's chief complaint, I considered admission might be necessary, however after reassuring ED workup feel patient is reasonable for discharge.  Discussed results, findings, treatment and follow up. Patient advised of return precautions. Patient verbalized understanding and agreed with plan.  Portions of this note were generated with Scientist, clinical (histocompatibility and immunogenetics). Dictation errors may occur despite best attempts at proofreading.         Final Clinical Impression(s) / ED Diagnoses Final diagnoses:  None    Rx / DC Orders ED Discharge Orders     None         Berneice Heinrich 06/24/22 1014    Benjiman Core, MD 06/24/22 1526

## 2022-06-24 NOTE — ED Triage Notes (Signed)
Pt reported to ED for evaluation of bee sting to lower lip yesterday. Pt states he woke up this morning and lip has swollen. Denies any shortness of breath, difficulty breathing, throat tightness or excessive salivation.

## 2022-08-15 ENCOUNTER — Encounter: Payer: Self-pay | Admitting: Neurology

## 2022-08-15 ENCOUNTER — Ambulatory Visit (INDEPENDENT_AMBULATORY_CARE_PROVIDER_SITE_OTHER): Payer: No Typology Code available for payment source | Admitting: Neurology

## 2022-08-15 VITALS — BP 138/83 | HR 70 | Resp 18 | Wt 215.0 lb

## 2022-08-15 DIAGNOSIS — R569 Unspecified convulsions: Secondary | ICD-10-CM

## 2022-08-15 NOTE — Patient Instructions (Signed)
Good to see you doing well. Follow-up as needed, call for any changes.    Seizure Precautions: 1. If medication has been prescribed for you to prevent seizures, take it exactly as directed.  Do not stop taking the medicine without talking to your doctor first, even if you have not had a seizure in a long time.   2. Avoid activities in which a seizure would cause danger to yourself or to others.  Don't operate dangerous machinery, swim alone, or climb in high or dangerous places, such as on ladders, roofs, or girders.  Do not drive unless your doctor says you may.  3. If you have any warning that you may have a seizure, lay down in a safe place where you can't hurt yourself.    4.  No driving for 6 months from last seizure, as per [place] state law.   Please refer to the following link on the Epilepsy Foundation of America's website for more information: http://www.epilepsyfoundation.org/answerplace/Social/driving/drivingu.cfm   5.  Maintain good sleep hygiene. Avoid alcohol.  6.  Contact your doctor if you have any problems that may be related to the medicine you are taking.  7.  Call 911 and bring the patient back to the ED if:        A.  The seizure lasts longer than 5 minutes.       B.  The patient doesn't awaken shortly after the seizure  C.  The patient has new problems such as difficulty seeing, speaking or moving  D.  The patient was injured during the seizure  E.  The patient has a temperature over 102 F (39C)  F.  The patient vomited and now is having trouble breathing        
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
## 2023-05-14 ENCOUNTER — Encounter (HOSPITAL_COMMUNITY): Payer: Self-pay | Admitting: Emergency Medicine

## 2023-05-14 ENCOUNTER — Ambulatory Visit (HOSPITAL_COMMUNITY)
Admission: EM | Admit: 2023-05-14 | Discharge: 2023-05-14 | Disposition: A | Discharge status: Home / Self Care | Payer: BC Managed Care – PPO | Attending: Nurse Practitioner | Admitting: Nurse Practitioner

## 2023-05-14 DIAGNOSIS — L538 Other specified erythematous conditions: Secondary | ICD-10-CM | POA: Diagnosis not present

## 2023-05-14 DIAGNOSIS — W57XXXA Bitten or stung by nonvenomous insect and other nonvenomous arthropods, initial encounter: Secondary | ICD-10-CM

## 2023-05-14 DIAGNOSIS — S70262A Insect bite (nonvenomous), left hip, initial encounter: Secondary | ICD-10-CM | POA: Diagnosis present

## 2023-05-14 MED ORDER — DOXYCYCLINE HYCLATE 100 MG PO CAPS: 200.0000 mg | ORAL_CAPSULE | Freq: Once | ORAL | 0 refills | Status: AC

## 2023-05-14 NOTE — ED Provider Notes (Signed)
[facility]    CSN: [id number] Arrival date & time: 05/14/23  1751      History   Chief Complaint Chief Complaint  Patient presents with   Recent Tick Bite    HPI Jay Harrison is a 22 y.o. male.   Subjective:   Jay Harrison is a 22 y.o. male who presents for evaluation after removing a tick from his left hip area. He has a small red area at the site the tick removed.  He pulled it off about 3 hours prior to arrival.  He denies any pain, itching or discomfort at the site of the bite.  No fevers, body aches, neck pain, nausea or vomiting.  He has no history of Lyme's disease.  Patient reports that he feels fine and only came in for evaluation because his girlfriend made him.  The following portions of the patient's history were reviewed and updated as appropriate: allergies, current medications, past family history, past medical history, past social history, past surgical history, and problem list.        History reviewed. No pertinent past medical history.  Patient Active Problem List   Diagnosis Date Noted   Seizure (HCC) 09/04/2020    Past Surgical History:  Procedure Laterality Date   TONSILLECTOMY     TYMPANOSTOMY TUBE PLACEMENT         Home Medications    Prior to Admission medications   Medication Sig Start Date End Date Taking? Authorizing Provider  doxycycline (VIBRAMYCIN) 100 MG capsule Take 2 capsules (200 mg total) by mouth once for 1 dose. 05/14/23 05/14/23 Yes Lurline Idol, FNP  cyclobenzaprine (FLEXERIL) 10 MG tablet Take 1 tablet (10 mg total) by mouth 3 (three) times daily as needed for muscle spasms. Patient not taking: Reported on 08/15/2022 06/01/22   Roxy Horseman, PA-C  ibuprofen (ADVIL) 600 MG tablet Take 1 tablet (600 mg total) by mouth every 6 (six) hours as needed. 06/01/22   Roxy Horseman, PA-C    Family History Family History  Problem Relation Age of Onset   Cancer Father        unknown type    High  Cholesterol Maternal Grandfather     Social History Social History   Tobacco Use   Smoking status: Never   Smokeless tobacco: Never  Vaping Use   Vaping Use: Never used  Substance Use Topics   Alcohol use: Not Currently    Comment: 1-2 times a month, 1-2    Drug use: No     Allergies   Penicillins   Review of Systems Review of Systems  Constitutional:  Negative for fatigue and fever.  Gastrointestinal:  Negative for nausea and vomiting.  Musculoskeletal:  Negative for neck pain and neck stiffness.  Skin:  Positive for wound. Negative for rash.  Neurological:  Negative for dizziness and headaches.  All other systems reviewed and are negative.    Physical Exam Triage Vital Signs ED Triage Vitals  Enc Vitals Group     BP 05/14/23 1807 126/77     Pulse Rate 05/14/23 1807 70     Resp 05/14/23 1807 18     Temp 05/14/23 1807 98.2 F (36.8 C)     Temp Source 05/14/23 1807 Oral     SpO2 05/14/23 1807 95 %     Weight --      Height --      Head Circumference --      Peak Flow --  Pain Score 05/14/23 1808 0     Pain Loc --      Pain Edu? --      Excl. in GC? --    No data found.  Updated Vital Signs BP 126/77 (BP Location: Left Arm)   Pulse 70   Temp 98.2 F (36.8 C) (Oral)   Resp 18   SpO2 95%   Visual Acuity Right Eye Distance:   Left Eye Distance:   Bilateral Distance:    Right Eye Near:   Left Eye Near:    Bilateral Near:     Physical Exam Vitals reviewed.  Constitutional:      General: He is not in acute distress.    Appearance: Normal appearance. He is not ill-appearing or toxic-appearing.  HENT:     Head: Normocephalic.  Cardiovascular:     Rate and Rhythm: Normal rate and regular rhythm.  Pulmonary:     Effort: Pulmonary effort is normal.  Musculoskeletal:        General: Normal range of motion.     Cervical back: Normal range of motion and neck supple.  Skin:    General: Skin is warm and dry.     Findings: No rash.        Neurological:     General: No focal deficit present.     Mental Status: He is alert and oriented to person, place, and time.      UC Treatments / Results  Labs (all labs ordered are listed, but only abnormal results are displayed) Labs Reviewed  LYME DISEASE SEROLOGY W/REFLEX  SPOTTED FEVER GROUP ANTIBODIES    EKG   Radiology No results found.  Procedures Procedures (including critical care time)  Medications Ordered in UC Medications - No data to display  Initial Impression / Assessment and Plan / UC Course  I have reviewed the triage vital signs and the nursing notes.  Pertinent labs & imaging results that were available during my care of the patient were reviewed by me and considered in my medical decision making (see chart for details).    22 yo male senting for evaluation for tick bite.  No fevers, body aches, neck pain, nausea, vomiting or rash.  Small reddened area at the site where the tick was removed.  No rash noted at this time.  Patient is afebrile and nontoxic.  Will prophylactically prescribe doxycycline 200 mg x 1.  Labs and [facility] spotted fever pending.  Patient advised to monitor symptoms closely.  Follow-up as needed.  Today's evaluation has revealed no signs of a dangerous process. Discussed diagnosis with patient and/or guardian. Patient and/or guardian aware of their diagnosis, possible red flag symptoms to watch out for and need for close follow up. Patient and/or guardian understands verbal and written discharge instructions. Patient and/or guardian comfortable with plan and disposition.  Patient and/or guardian has a clear mental status at this time, good insight into illness (after discussion and teaching) and has clear judgment to make decisions regarding their care  Documentation was completed with the aid of voice recognition software. Transcription may contain typographical errors. Final Clinical Impressions(s) / UC Diagnoses   Final  diagnoses:  Tick bite of left hip, initial encounter     Discharge Instructions      You have been seen today due to a tick bite. pSome ticks carry germs that can make you sick. We are checking your blood work for [employer] Disease. The chance of catching Lyme disease from an individual  tick ranges from roughly zero to 50 percent. The risk of contracting Lyme disease from a tick bite depends on the tick species, where the tick came from, and how long it was biting you. I have sent a medication over to your pharmacy to take. A single dose of doxycycline has been shown to reduce the frequency of Lyme disease.   Go to the ED or return to the urgent care if: You have a fever or chills. You have a red rash that makes a circle (bull's-eye rash) in the bite area. You have redness and swelling where the tick bit you. You have a headache or stiff neck. You have pain in a muscle, joint, or bone. You are more tired than normal. You have trouble walking or moving your legs. You have numbness in your legs. You have tender or swollen lymph glands. You have belly (abdominal) pain, vomiting, watery poop (diarrhea), or weight loss.     ED Prescriptions     Medication Sig Dispense Auth. Provider   doxycycline (VIBRAMYCIN) 100 MG capsule Take 2 capsules (200 mg total) by mouth once for 1 dose. 2 capsule Lurline Idol, FNP      PDMP not reviewed this encounter.   Lurline Idol, [place] 05/14/23 1907

## 2023-05-14 NOTE — ED Triage Notes (Signed)
Pt reports getting bit by a tick today on the left hip. Denies any fever,body aches or rash around the area.

## 2023-05-14 NOTE — Discharge Instructions (Signed)
You have been seen today due to a tick bite. pSome ticks carry germs that can make you sick. We are checking your blood work for [employer] Disease. The chance of catching Lyme disease from an individual tick ranges from roughly zero to 50 percent. The risk of contracting Lyme disease from a tick bite depends on the tick species, where the tick came from, and how long it was biting you. I have sent a medication over to your pharmacy to take. A single dose of doxycycline has been shown to reduce the frequency of Lyme disease.   Go to the ED or return to the urgent care if: You have a fever or chills. You have a red rash that makes a circle (bull's-eye rash) in the bite area. You have redness and swelling where the tick bit you. You have a headache or stiff neck. You have pain in a muscle, joint, or bone. You are more tired than normal. You have trouble walking or moving your legs. You have numbness in your legs. You have tender or swollen lymph glands. You have belly (abdominal) pain, vomiting, watery poop (diarrhea), or weight loss.

## 2023-05-16 LAB — LYME DISEASE SEROLOGY W/REFLEX: Lyme Total Antibody EIA: NEGATIVE

## 2023-05-20 LAB — SPOTTED FEVER GROUP ANTIBODIES
Spotted Fever Group IgG: <1:64 {titer}
Spotted Fever Group IgM: <1:64 {titer}

## 2023-08-03 ENCOUNTER — Ambulatory Visit (HOSPITAL_COMMUNITY)
Admission: EM | Admit: 2023-08-03 | Discharge: 2023-08-03 | Disposition: A | Discharge status: Home / Self Care | Payer: No Typology Code available for payment source

## 2023-08-03 ENCOUNTER — Encounter (HOSPITAL_COMMUNITY): Payer: Self-pay | Admitting: Emergency Medicine

## 2023-08-03 DIAGNOSIS — R197 Diarrhea, unspecified: Secondary | ICD-10-CM | POA: Diagnosis not present

## 2023-08-03 NOTE — Discharge Instructions (Signed)
Take Imodium per package directions. Drink plenty of fluids to stay hydrated. Change your diet to eat bland, simple foods, until your diarrhea is better.   If you have diarrhea for 2 weeks, then we need to do stool testing.

## 2023-08-03 NOTE — ED Triage Notes (Signed)
On 8/3 ate something and felt bad on 8/4. Reports having diarrhea all week. Tried Pepto Bismol on Wed. Yesterday took Imodium once helped for a couple hours.

## 2023-08-03 NOTE — ED Provider Notes (Signed)
[facility]    CSN: [id number] Arrival date & time: 08/03/23  0809      History   Chief Complaint Chief Complaint  Patient presents with   Diarrhea    HPI Jay Harrison is a 22 y.o. male. He reports diarrhea up to 10x a day since 07/26/23. Tried pepto dismal for new relief.  Tried Imodium for 1 dose yesterday at 6 PM and did have some relief of diarrhea for about 6 hours.  Has not taken subsequent doses of Imodium.  Has not changed his diet.  Describes diarrhea as liquid or loose.  He can identify partially digested food items in his stool.  Denies any blood or mucus in his stool.  Has not traveled the country.  Has not had water from contaminated food sources.  No one else in his social circle has similar symptoms.  Denies abdominal pain, nausea, vomiting, fever, chills  Diarrhea   History reviewed. No pertinent past medical history.  Patient Active Problem List   Diagnosis Date Noted   Seizure (HCC) 09/04/2020    Past Surgical History:  Procedure Laterality Date   TONSILLECTOMY     TYMPANOSTOMY TUBE PLACEMENT         Home Medications    Prior to Admission medications   Medication Sig Start Date End Date Taking? Authorizing Provider  cyclobenzaprine (FLEXERIL) 10 MG tablet Take 1 tablet (10 mg total) by mouth 3 (three) times daily as needed for muscle spasms. Patient not taking: Reported on 08/15/2022 06/01/22   Roxy Horseman, PA-C  ibuprofen (ADVIL) 600 MG tablet Take 1 tablet (600 mg total) by mouth every 6 (six) hours as needed. 06/01/22   Roxy Horseman, PA-C    Family History Family History  Problem Relation Age of Onset   Cancer Father        unknown type    High Cholesterol Maternal Grandfather     Social History Social History   Tobacco Use   Smoking status: Never   Smokeless tobacco: Never  Vaping Use   Vaping status: Never Used  Substance Use Topics   Alcohol use: Not Currently    Comment: 1-2 times a month, 1-2    Drug use: No      Allergies   Penicillins   Review of Systems Review of Systems  Gastrointestinal:  Positive for diarrhea.     Physical Exam Triage Vital Signs ED Triage Vitals  Encounter Vitals Group     BP 08/03/23 0824 124/83     Systolic BP Percentile --      Diastolic BP Percentile --      Pulse Rate 08/03/23 0824 87     Resp 08/03/23 0824 16     Temp 08/03/23 0824 98.6 F (37 C)     Temp Source 08/03/23 0824 Oral     SpO2 08/03/23 0824 97 %     Weight --      Height --      Head Circumference --      Peak Flow --      Pain Score 08/03/23 0822 0     Pain Loc --      Pain Education --      Exclude from Growth Chart --    No data found.  Updated Vital Signs BP 124/83 (BP Location: Right Arm)   Pulse 87   Temp 98.6 F (37 C) (Oral)   Resp 16   SpO2 97%   Visual Acuity Right Eye Distance:  Left Eye Distance:   Bilateral Distance:    Right Eye Near:   Left Eye Near:    Bilateral Near:     Physical Exam Constitutional:      Appearance: Normal appearance. He is not ill-appearing.  Cardiovascular:     Rate and Rhythm: Normal rate and regular rhythm.  Pulmonary:     Effort: Pulmonary effort is normal.     Breath sounds: Normal breath sounds.  Abdominal:     General: Abdomen is flat. Bowel sounds are normal.     Tenderness: There is no abdominal tenderness. There is no guarding or rebound.  Neurological:     Mental Status: He is alert.      UC Treatments / Results  Labs (all labs ordered are listed, but only abnormal results are displayed) Labs Reviewed - No data to display  EKG   Radiology No results found.  Procedures Procedures (including critical care time)  Medications Ordered in UC Medications - No data to display  Initial Impression / Assessment and Plan / UC Course  I have reviewed the triage vital signs and the nursing notes.  Pertinent labs & imaging results that were available during my care of the patient were reviewed by me and  considered in my medical decision making (see chart for details).     I suspect patient's symptoms have been prolonged because he has not altered his diet.  Discussed food choices for relieving diarrhea.  Discussed using Imodium.  I see no reason to do any stool testing at this point in time but advised patient to return if he still has diarrhea after a total of 2 weeks.  Final Clinical Impressions(s) / UC Diagnoses   Final diagnoses:  Diarrhea, unspecified type     Discharge Instructions      Take Imodium per package directions. Drink plenty of fluids to stay hydrated. Change your diet to eat bland, simple foods, until your diarrhea is better.   If you have diarrhea for 2 weeks, then we need to do stool testing.    ED Prescriptions   None    PDMP not reviewed this encounter.   Cathlyn Parsons, NP 08/03/23 [phone]
